# Patient Record
Sex: Female | Born: 1965 | Race: White | Hispanic: No | Marital: Married | Smoking: Never smoker
Health system: Southern US, Community
[De-identification: ages and names within clinical notes are randomized; demographics above are authoritative.]

---

## 1997-12-04 ENCOUNTER — Other Ambulatory Visit: Admission: RE | Admit: 1997-12-04 | Discharge: 1997-12-04 | Payer: Self-pay | Admitting: Obstetrics and Gynecology

## 1999-01-26 ENCOUNTER — Other Ambulatory Visit: Admission: RE | Admit: 1999-01-26 | Discharge: 1999-01-26 | Payer: Self-pay | Admitting: Obstetrics and Gynecology

## 1999-10-27 ENCOUNTER — Other Ambulatory Visit: Admission: RE | Admit: 1999-10-27 | Discharge: 1999-10-27 | Payer: Self-pay | Admitting: Obstetrics and Gynecology

## 2000-02-25 ENCOUNTER — Encounter: Admission: RE | Admit: 2000-02-25 | Discharge: 2000-05-25 | Payer: Self-pay | Admitting: Obstetrics & Gynecology

## 2000-05-30 ENCOUNTER — Inpatient Hospital Stay (HOSPITAL_COMMUNITY): Admission: AD | Admit: 2000-05-30 | Discharge: 2000-05-30 | Payer: Self-pay | Admitting: Obstetrics and Gynecology

## 2000-06-04 ENCOUNTER — Inpatient Hospital Stay (HOSPITAL_COMMUNITY): Admission: AD | Admit: 2000-06-04 | Discharge: 2000-06-04 | Payer: Self-pay | Admitting: Obstetrics and Gynecology

## 2000-06-04 ENCOUNTER — Inpatient Hospital Stay (HOSPITAL_COMMUNITY): Admission: AD | Admit: 2000-06-04 | Discharge: 2000-06-08 | Payer: Self-pay | Admitting: Obstetrics and Gynecology

## 2000-06-09 ENCOUNTER — Encounter: Admission: RE | Admit: 2000-06-09 | Discharge: 2000-09-07 | Payer: Self-pay | Admitting: Obstetrics and Gynecology

## 2000-07-18 ENCOUNTER — Other Ambulatory Visit: Admission: RE | Admit: 2000-07-18 | Discharge: 2000-07-18 | Payer: Self-pay | Admitting: Obstetrics and Gynecology

## 2000-09-09 ENCOUNTER — Encounter: Admission: RE | Admit: 2000-09-09 | Discharge: 2000-09-20 | Payer: Self-pay | Admitting: Obstetrics and Gynecology

## 2002-03-11 ENCOUNTER — Other Ambulatory Visit: Admission: RE | Admit: 2002-03-11 | Discharge: 2002-03-11 | Payer: Self-pay | Admitting: Obstetrics & Gynecology

## 2002-03-11 ENCOUNTER — Other Ambulatory Visit: Admission: RE | Admit: 2002-03-11 | Discharge: 2002-03-11 | Payer: Self-pay | Admitting: Obstetrics and Gynecology

## 2002-10-03 ENCOUNTER — Inpatient Hospital Stay (HOSPITAL_COMMUNITY): Admission: AD | Admit: 2002-10-03 | Discharge: 2002-10-07 | Payer: Self-pay | Admitting: *Deleted

## 2002-10-04 ENCOUNTER — Encounter (INDEPENDENT_AMBULATORY_CARE_PROVIDER_SITE_OTHER): Payer: Self-pay

## 2002-10-08 ENCOUNTER — Encounter: Admission: RE | Admit: 2002-10-08 | Discharge: 2002-11-07 | Payer: Self-pay | Admitting: Obstetrics & Gynecology

## 2002-10-10 ENCOUNTER — Encounter: Payer: Self-pay | Admitting: Obstetrics & Gynecology

## 2002-10-10 ENCOUNTER — Encounter: Admission: RE | Admit: 2002-10-10 | Discharge: 2002-10-10 | Payer: Self-pay | Admitting: Obstetrics & Gynecology

## 2002-10-31 ENCOUNTER — Other Ambulatory Visit: Admission: RE | Admit: 2002-10-31 | Discharge: 2002-10-31 | Payer: Self-pay | Admitting: Obstetrics & Gynecology

## 2002-11-08 ENCOUNTER — Encounter: Admission: RE | Admit: 2002-11-08 | Discharge: 2002-12-08 | Payer: Self-pay | Admitting: Obstetrics & Gynecology

## 2003-11-12 ENCOUNTER — Other Ambulatory Visit: Admission: RE | Admit: 2003-11-12 | Discharge: 2003-11-12 | Payer: Self-pay | Admitting: Obstetrics & Gynecology

## 2004-05-18 ENCOUNTER — Emergency Department (HOSPITAL_COMMUNITY): Admission: EM | Admit: 2004-05-18 | Discharge: 2004-05-18 | Payer: Self-pay | Admitting: Family Medicine

## 2004-11-29 ENCOUNTER — Ambulatory Visit: Payer: Self-pay | Admitting: Internal Medicine

## 2005-09-15 ENCOUNTER — Encounter: Admission: RE | Admit: 2005-09-15 | Discharge: 2005-09-15 | Payer: Self-pay | Admitting: Obstetrics & Gynecology

## 2005-09-27 ENCOUNTER — Encounter: Admission: RE | Admit: 2005-09-27 | Discharge: 2005-09-27 | Payer: Self-pay | Admitting: *Deleted

## 2006-10-06 ENCOUNTER — Encounter: Admission: RE | Admit: 2006-10-06 | Discharge: 2006-10-06 | Payer: Self-pay | Admitting: Obstetrics & Gynecology

## 2006-10-17 ENCOUNTER — Observation Stay (HOSPITAL_COMMUNITY): Admission: AD | Admit: 2006-10-17 | Discharge: 2006-10-17 | Payer: Self-pay | Admitting: General Surgery

## 2007-10-17 ENCOUNTER — Ambulatory Visit: Payer: Self-pay | Admitting: Internal Medicine

## 2007-10-17 DIAGNOSIS — J309 Allergic rhinitis, unspecified: Secondary | ICD-10-CM | POA: Insufficient documentation

## 2007-10-23 ENCOUNTER — Encounter: Admission: RE | Admit: 2007-10-23 | Discharge: 2007-10-23 | Payer: Self-pay | Admitting: Obstetrics & Gynecology

## 2007-10-29 ENCOUNTER — Encounter: Payer: Self-pay | Admitting: Internal Medicine

## 2007-10-29 LAB — CONVERTED CEMR LAB
Basophils Absolute: 0.1 K/uL
Basophils Relative: 0.9 %
Eosinophils Absolute: 0.1 K/uL
Eosinophils Relative: 0.7 %
HCT: 40.5 %
Hemoglobin: 13.7 g/dL
IgE (Immunoglobulin E), Serum: 88.1 [IU]/mL
Lymphocytes Relative: 19.4 %
MCHC: 33.7 g/dL
MCV: 87.7 fL
Monocytes Absolute: 0.4 K/uL
Monocytes Relative: 5.1 %
Neutro Abs: 6.4 K/uL
Neutrophils Relative %: 73.9 %
Platelets: 308 K/uL
RBC: 4.62 M/uL
RDW: 11.8 %
WBC: 8.7 10*3/microliter

## 2007-11-06 ENCOUNTER — Encounter: Payer: Self-pay | Admitting: Internal Medicine

## 2007-11-06 ENCOUNTER — Telehealth: Payer: Self-pay | Admitting: Internal Medicine

## 2007-11-08 ENCOUNTER — Ambulatory Visit: Payer: Self-pay | Admitting: Internal Medicine

## 2007-12-18 ENCOUNTER — Encounter: Payer: Self-pay | Admitting: Internal Medicine

## 2008-11-25 IMAGING — MG MM SCREEN MAMMOGRAM BILATERAL
4 series · 4 of 4 positions shown · non-contrast
Comparison: Prior studies.

DG SCREEN MAMMOGRAM BILATERAL
Bilateral CC and MLO view(s) were taken.
Technologist: Birmata Kobene

DIGITAL SCREENING MAMMOGRAM WITH CAD:

[R CC]
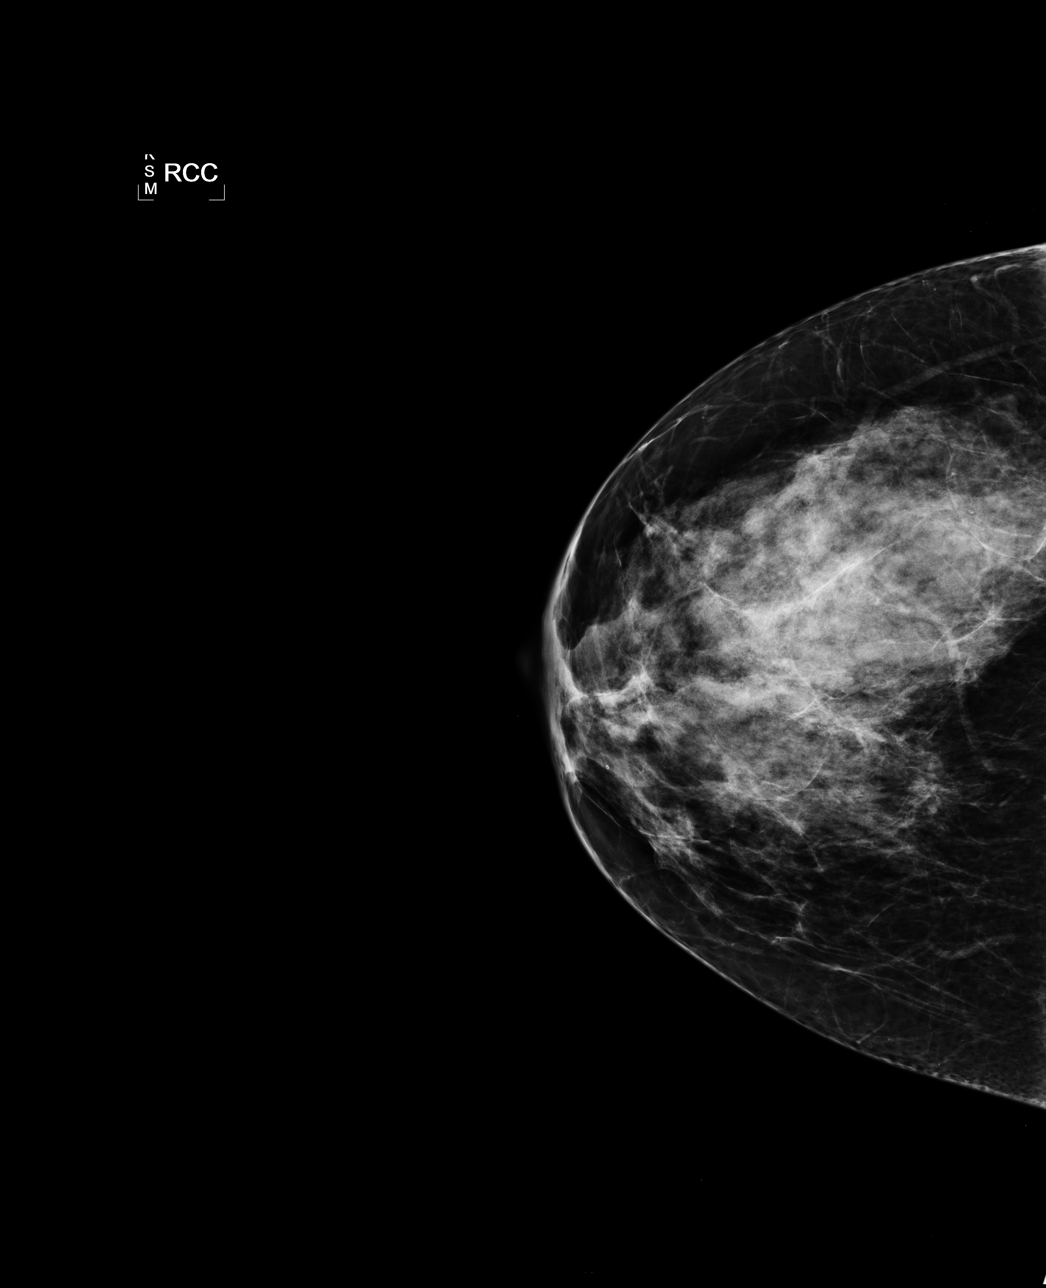

[L CC]
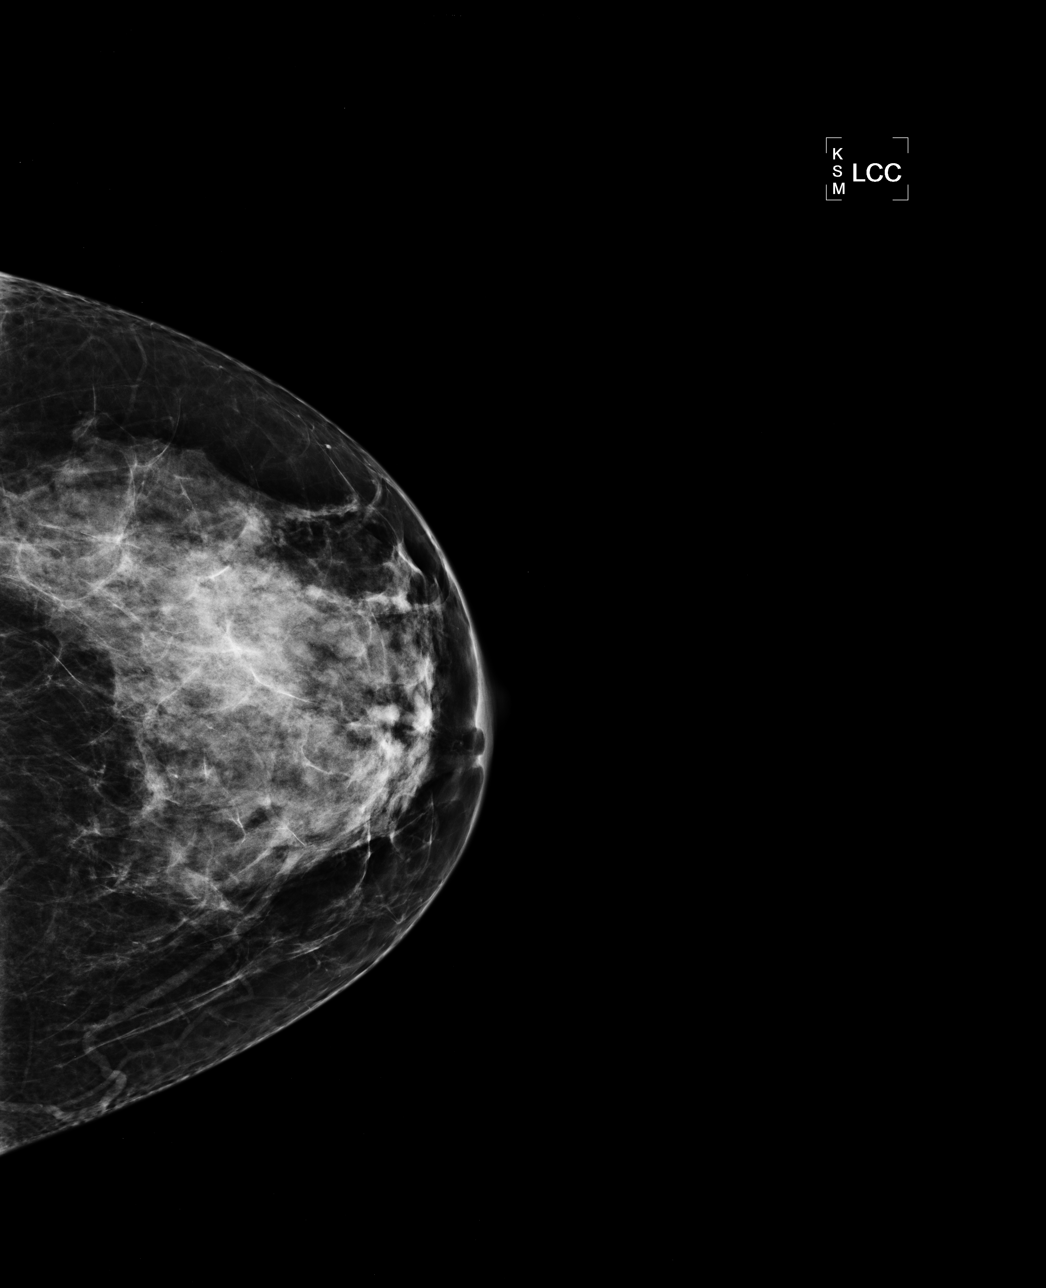

[L MLO]
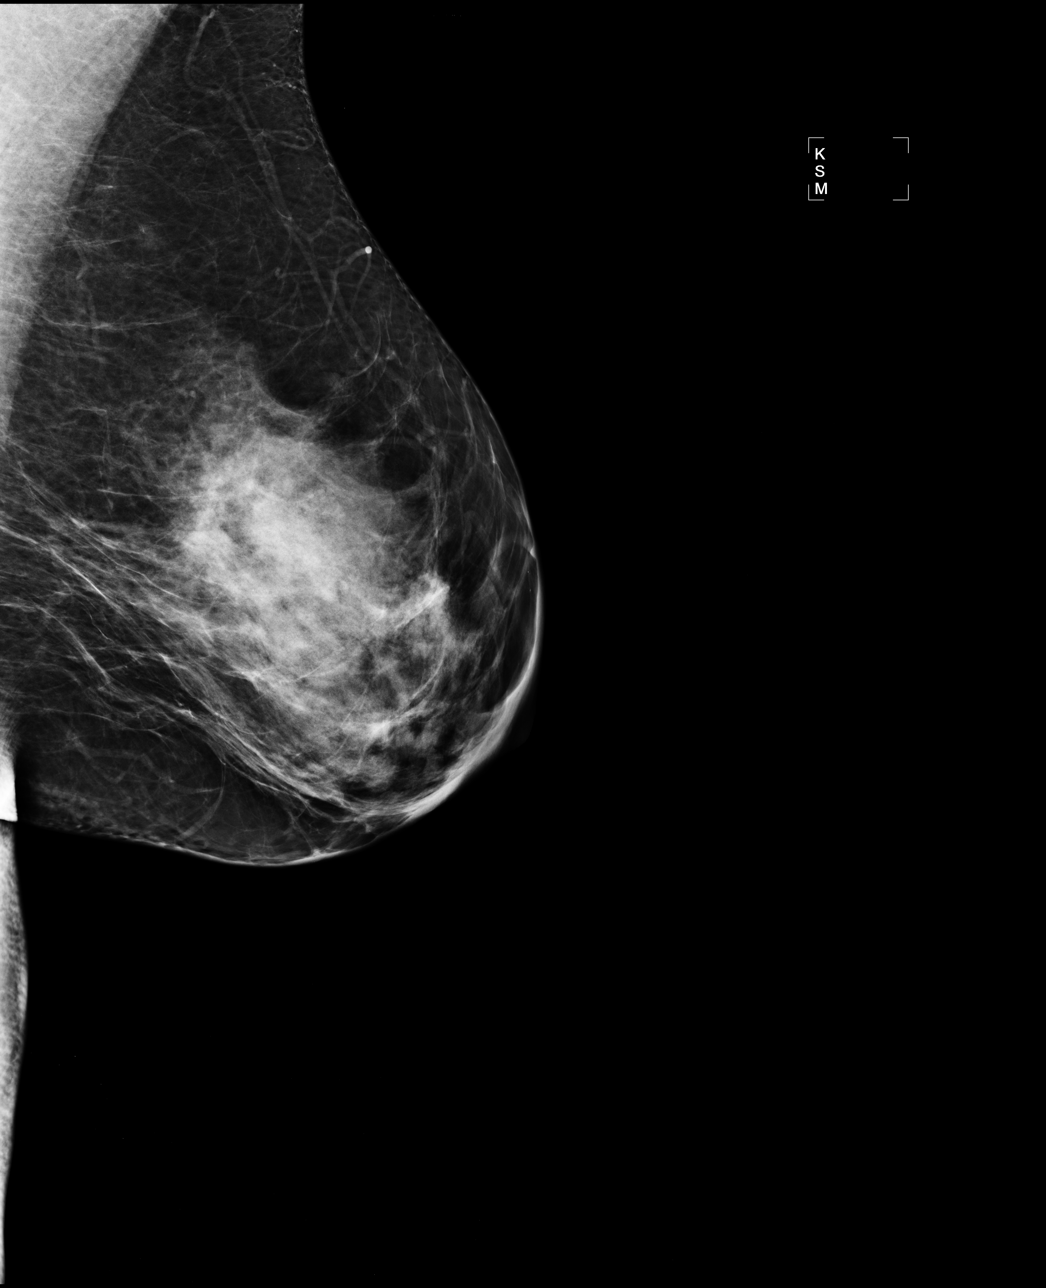

[R MLO]
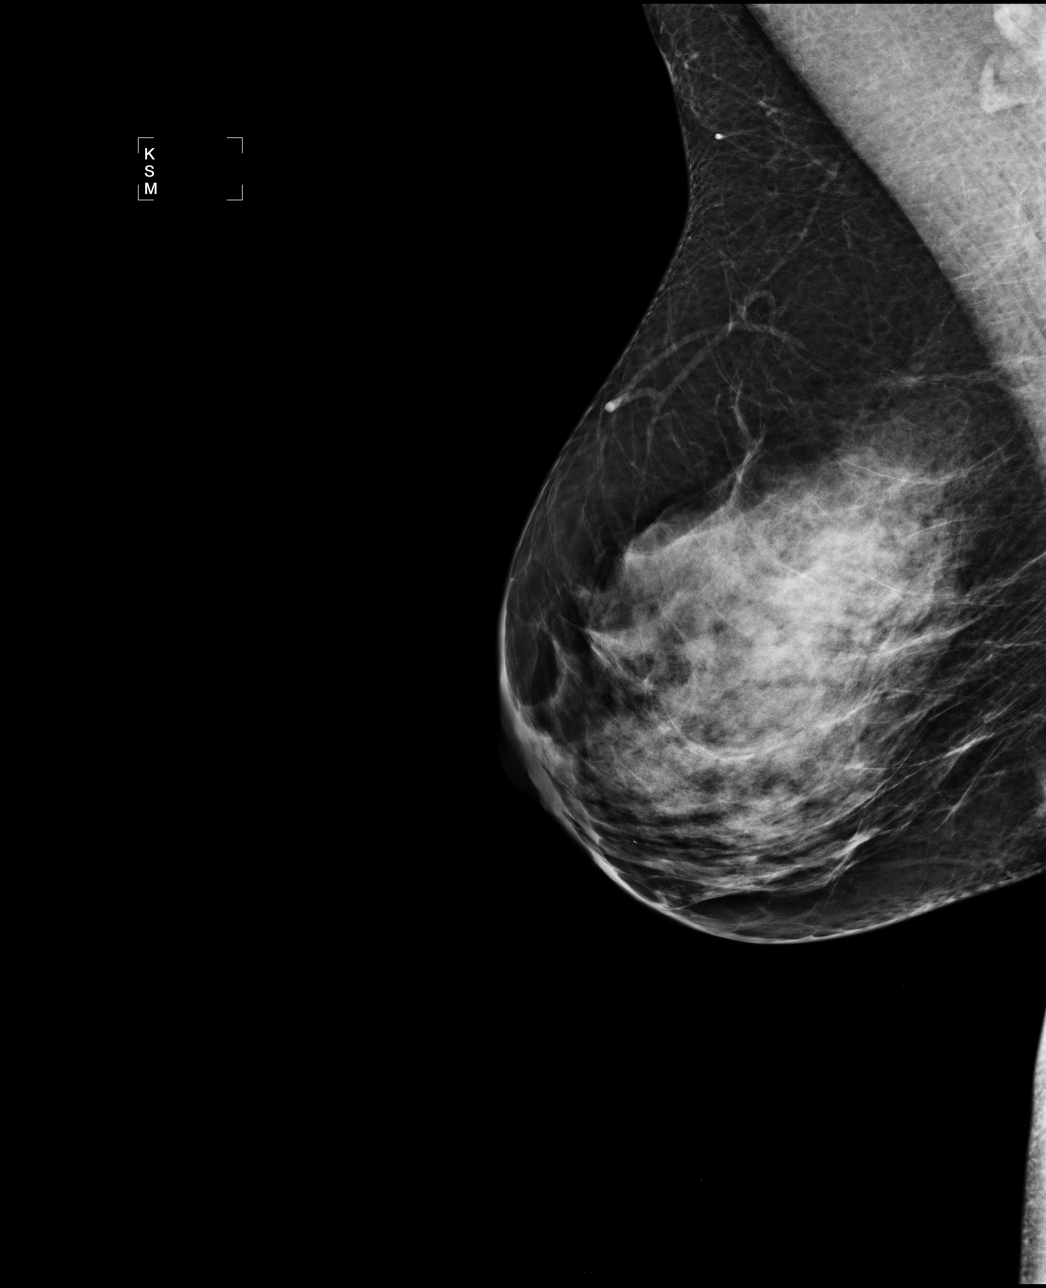

[4 of 4 positions shown; findings below may reference images not displayed]

The breast tissue is heterogeneously dense.  There is no dominant mass, architectural distortion or
calcification to suggest malignancy.
IMPRESSION: No mammographic evidence of malignancy.  Suggest yearly screening mammography.

ASSESSMENT: Negative - BI-RADS 1

Screening mammogram in 1 year.
ANALYZED BY COMPUTER AIDED DETECTION. , THIS PROCEDURE WAS A DIGITAL MAMMOGRAM.

## 2009-01-30 ENCOUNTER — Ambulatory Visit (HOSPITAL_COMMUNITY): Admission: RE | Admit: 2009-01-30 | Discharge: 2009-01-30 | Payer: Self-pay | Admitting: Family Medicine

## 2009-01-30 ENCOUNTER — Ambulatory Visit: Payer: Self-pay | Admitting: *Deleted

## 2009-02-27 ENCOUNTER — Other Ambulatory Visit: Admission: RE | Admit: 2009-02-27 | Discharge: 2009-02-27 | Payer: Self-pay | Admitting: Family Medicine

## 2010-07-19 ENCOUNTER — Encounter
Admission: RE | Admit: 2010-07-19 | Discharge: 2010-07-19 | Payer: Self-pay | Source: Home / Self Care | Attending: Obstetrics & Gynecology | Admitting: Obstetrics & Gynecology

## 2010-09-09 NOTE — Miscellaneous (Signed)
Summary: Injection Record/[facility]  Injection Record/[facility]   Imported By: Sherian Rein 12/29/2009 08:00:26  _____________________________________________________________________  External Attachment:    Type:   Image     Comment:   External Document

## 2010-12-24 NOTE — Op Note (Signed)
Christine Willis, Christine Willis             ACCOUNT NO.:  [id number]   MEDICAL RECORD NO.:  [id number]          PATIENT TYPE:  INP   LOCATION:  1321                         FACILITY:  [facility]   PHYSICIAN:  Ollen Gross. Vernell Morgans, M.D. DATE OF BIRTH:  March 15, 1966   DATE OF PROCEDURE:  10/17/2006  DATE OF DISCHARGE:  10/17/2006                               OPERATIVE REPORT   PREOPERATIVE DIAGNOSIS:  Perirectal abscess.   POSTOPERATIVE DIAGNOSES:  1. Perirectal abscess.  2. Anal fistula.   PROCEDURE:  I&D of perirectal abscess and placement of a seton.   SURGEON:  Dr. Carolynne Edouard   ANESTHESIA:  General endotracheal.   PROCEDURE:  After informed consent was obtained, the patient was brought  to the operating room and placed in a supine position on the operating  room table.  After adequate induction of general anesthesia, the patient  was placed in lithotomy position, and her perirectal area was prepped  with Betadine and draped in the usual sterile manner.  The abscess was  palpable in the left anterior perirectal region.  It could be palpated  easily both from within and outside the rectum.  A bullet retractor was  placed in the rectum and in the left anterior position.  A small opening  was identified.  A small silver probe was placed in this opening and  easily located the abscess.  The perirectal area was then infiltrated  with 0.25% Marcaine with epinephrine.  A small radial incision was made  on the skin overlying the abscess.  This incision was carried down  through the skin and into the subcutaneous tissue sharply with the  electrocautery.  A hemostat was used to pop into the perirectal abscess  cavity and allowed to drain.  It was also draining readily from within  the fistulous opening inside the rectum.  A silver probe was then placed  from inside the rectum into the abscess cavity and out through the skin.  A blue vesi-loop was then anchored to the end of the silver probe and  brought  through the fistulous opening.  The seton was placed under  tension and then cinched snugly around the bridge of tissue with a 3-0  silk tie.  Hemostasis was achieved using the Bovie electrocautery.  The  wound externally was then packed with a 4x4 gauze, and then sterile  dressings were applied.  The patient tolerated the procedure well.  At  the end of the case, all needle, sponge, and instrument counts were  correct.  The patient was then awakened and taken to recovery in stable  condition.      Ollen Gross. Vernell Morgans, M.D.  Electronically Signed     PST/MEDQ  D:  10/17/2006  T:  10/19/2006  Job:  [id number]

## 2010-12-24 NOTE — H&P (Signed)
NAME:  Christine Willis, Christine Willis                       ACCOUNT NO.:  [id number]   MEDICAL RECORD NO.:  [id number]                   PATIENT TYPE:  INP   LOCATION:  9170                                 FACILITY:  [facility]   PHYSICIAN:  Genia Del, M.D.             DATE OF BIRTH:  Apr 19, 1966   DATE OF ADMISSION:  10/03/2002  DATE OF DISCHARGE:                                HISTORY & PHYSICAL   HISTORY AND PHYSICAL:  The patient is a 45 year old G2, P1 at 40 weeks and 1  day gestation.  Expected date of delivery October 02, 2002 by ultrasound.   REASON FOR ADMISSION:  Induction post dates.   HISTORY OF PRESENT ILLNESS:  Fetal movements positive.  Irregular uterine  contractions.  No vaginal bleeding.  No fluid leak.  No PIH symptoms.   MEDICATIONS:  Prenate vitamins.   ALLERGIES:  Sensitivity:  NITROUS OXIDE and MORPHINE.   SOCIAL HISTORY:  Married.  Nonsmoker.   OBSTETRICS:  October 2001 40 weeks low transverse cesarean section for  failure to progress.  Baby was occiput posterior.  Girl 8 pounds 2 ounces.  The patient had postpartum depression.   GYNECOLOGY:  No STD.   PAST MEDICAL HISTORY:  1. IBS.  2. Migraines.   PAST SURGICAL HISTORY:  1. Liposuction.  2. Lasik eye surgery.  3. Rhinoplasty.   HISTORY OF PRESENT PREGNANCY:  First trimester was normal.  The patient had  a hemoglobin at 12.7, platelets 290,000.  Blood type O-.  Rh antibody  negative.  Toxo negative.  RPR nonreactive.  HBSAG negative.  HIV  nonreactive.  Rubella titer is immune.  Gonorrhea and Chlamydia negative.  Pap smear within normal limits.  In the second trimester after informed  consent the patient declined amniocentesis for advanced maternal age at 45  years old.  She had a triple test that was within normal limits at 17 weeks.  Ultrasound review of anatomy at 22+ weeks was within normal limits.  Expected date of delivery was changed because of discordance for October 02, 2001.  In the third  trimester patient declined RhoGAM for Rh negative  because her husband is Rh negative as well.  One hour GTT was 114, within  normal limits.  Group B Strep was negative at 35 weeks.  Uterine height  responded well throughout pregnancy.  An ultrasound showed an appropriate  for gestational age baby with amniotic fluid index within normal limits,  cephalic presentation.  Vaginal examination on last visit was 2+, 60-70%,  vertex, -2.  Membranes intact.  Blood pressures remained normal throughout  pregnancy.   REVIEW OF SYSTEMS:  CONSTITUTIONAL:  Negative.  HEENT:  Negative.  CARDIOVASCULAR:  Negative.  RESPIRATORY:  Negative.  GASTROINTESTINAL:  Negative.  UROLOGIC:  Negative.  DERMATOLOGIC:  Negative.  ENDOCRINOLOGIC:  Negative.  NEUROLOGIC:  Negative.   PHYSICAL EXAMINATION:  GENERAL:  No apparent distress.  VITAL SIGNS:  Blood pressure  100/63, pulse 90, temperature 97.5, respiratory  rate 20.  LUNGS:  Clear.  CARDIAC:  Regular rhythm.  ABDOMEN:  Gravid.  Cephalic presentation.  PELVIC:  Vaginal examination 3 cm, 70%, vertex, -2, fixed.  EXTREMITIES:  Lower limbs normal.   LABORATORIES:  Monitoring:  Fetal heart rate baseline 140-145, good  accelerations, no decelerations, reactive NST.  No regular uterine  contractions.   IMPRESSION:  1. G2, P1 40 weeks and 1 day gestation by ultrasound.  2. History of previous low transverse cesarean section.  3. Vaginal birth after cesarean candidate.  4. Induction for post dates.  5. Fetal well-being reassuring.  6. Group B Strep negative.   PLAN:  Admit to labor and delivery.  Low dose Pitocin protocol.  Artificial  rupture of membranes.  Monitoring.  Close follow-up.  Risks of uterine  rupture or dehiscence discussed with patient.  Evaluated at about 1%.  The  patient understands risks and procedures and agrees with management.                                               Genia Del, M.D.    ML/MEDQ  D:  10/03/2002  T:   10/03/2002  Job:  [id number]

## 2010-12-24 NOTE — Op Note (Signed)
[facility] [place]  Patient:    Christine Willis, Christine Willis                    MRN: [id number] Adm. Date:  [id number] Attending:  Cordelia Pen Ii                           Operative Report  PREOPERATIVE DIAGNOSIS:       Pregnancy at term with failure to descend.  POSTOPERATIVE DIAGNOSIS:      Pregnancy at term with failure to descend.  PROCEDURE:                    Low transverse cesarean section.  SURGEON:                      Juluis Mire, M.D.  ANESTHESIA:                   Epidural.  ESTIMATED BLOOD LOSS:         800 cc.  PACKS AND DRAINS:             None.  INTRAOPERATIVE BLOOD PLACED:  None.  COMPLICATIONS:                None.  INDICATIONS:                  A 45 year old prima gravid married white female presented with spontaneous onset of labor Sunday evening.  Did require Pitocin augmentation.  Became completely dilated.  After three hours plus of pushing the fetal vertex remained arrested at 0 station.  It was noted to be occiput posterior with a military orientation to the vertex.  We were unable to flex or mainly rotate the vertex.  Due to lack of descent and this presentation, the decision was decided to proceed with primary cesarean section.  The risks were discussed including the risk of infection, the risk of hemorrhage that could necessitate transfusion with the risk of AIDS or hepatitis, the risk of injury to adjacent organs including bladder, bowel, or ureters that could require further exploratory surgery, the risk of deep venous thrombosis and pulmonary embolus.  PROCEDURE:                    Patient taken to the OR and placed in supine position with left lateral tilt.  After satisfactory level of epidural anesthesia was obtained the abdomen was prepped with Betadine and draped in a sterile field.  A low transverse skin incision was made with a knife and carried through subcutaneous tissue.  The anterior rectus fascia  entered sharply.  The incision of the fascia extended laterally.  The fascia taken off the muscle superiorly and inferiorly using both blunt and sharp dissection. Rectus muscles were separated in the midline.  The peritoneum was entered sharply.  The incision of the perineum extended both superiorly and inferiorly.  Low transverse bladder flap was developed.  A low transverse uterine incision was begun with the knife and extended laterally using ______ traction.  The infant presented in the vertex presentation and delivered with elevation of head and fundal pressure.  The infant was occiput posterior and somewhat asynclitic.  The infant was a viable female who weighed 8 pounds 2 ounces.  Apgars were 9/9.  Umbilical pH is pending.  The placenta was then delivered manually.  The uterus wiped free of remaining membranes  and placenta.  Uterus was then closed with running locking suture of 0 Chromic using a two layer closure technique.  Hemostasis was excellent.  Urine output was slightly blood tinged, but clearing in the tube.  ______ irrigated. Hemostasis was excellent.  Tubes and ovaries visualized, noted to be unremarkable.  The muscles reapproximated with a running suture of 3-0 Vicryl. Fascia closed with a running suture of PDS.  Skin was closed with staples and Steri-Strips.  Sponge, instrument, needle counts were correct by ______ nurse x 2.  Foley catheter was clearing at the time of closure.  Patient tolerated procedure well and was returned to recovery room in good condition. DD:  06/05/00 TD:  06/05/00 Job: [id number]

## 2010-12-24 NOTE — Op Note (Signed)
NAME:  Christine Willis, Christine Willis                       ACCOUNT NO.:  [id number]   MEDICAL RECORD NO.:  [id number]                   PATIENT TYPE:  INP   LOCATION:  9103                                 FACILITY:  [facility]   PHYSICIAN:  Genia Del, M.D.             DATE OF BIRTH:  10/08/1965   DATE OF PROCEDURE:  10/04/2002  DATE OF DISCHARGE:                                 OPERATIVE REPORT   PREOPERATIVE DIAGNOSES:  1. A 40+ weeks gestation.  2. Unsuccessful V-back.  3. Failure to descend in second stage, associated with right posterior     occiput presentation.   POSTOPERATIVE DIAGNOSES:  1. A 40+ weeks gestation.  2. Unsuccessful V-back.  3. Failure to descend in second stage, associated with right posterior     occiput presentation.  4. Irregularly shaped uterus with no septum.  5. Uteroanterior abdominal wall adhesion.   SURGEON:  Genia Del, M.D.   ANESTHESIOLOGIST:  Estelle Grumbles. Fransisca Kaufmann, M.D.   INTERVENTION:  Repeat urgent low transverse cesarean section and lysis of  adhesions.   PROCEDURE:  Under epidural anesthesia, the patient is in 15-degree left  decubitus position.  She is prepped with Betadine on the abdomen, suprapubic  and vulvar areas.  The bladder catheter is already in place, and the patient  is draped as usual.  A Pfannenstiel incision was made with a scalpel at the  site of the previous scar.  We then opened the adipose tissue with the  electrocautery.  We opened the aponeurosis transversely with the Mayo  scissors, and freed the aponeurosis from the rectile muscles superiorly and  inferiorly.  The bladder is quite full; the bladder catheter is repositioned  -- which helps drain the bladder.  We then opened the peritoneum over the  lower uterine segment transversely with the neck scissors, and declined the  bladder downward.  The lower uterine segment is extremely thin and bulging,  but no rupture is present.  We then opened the lower uterine segment  transversely with the scalpel, and then extended on each side with the  scissors.  The amniotic fluid is clear.  The fetus is in cephalic  presentation, low in the pelvis (occiput posterior).  Birth of the baby boy  at 6:39 a.m.  The baby is suctioned; the cord is clamped and cut.  The  neonatal team takes care of the baby.  Apgars are 8 and 9.  The placenta is evacuated manually.  It is complete.  Revision of the uterus  is done.  The uterus is exteriorized for repair; it is well contracted with  Pitocin IV.  Ancef 1 g IV is given.  We note an irregularly shaped uterus.  The last horn is larger than the right horn, but no septation is present  inside the intrauterine cavity.  Both ovaries are normal in size and  appearance.  Both tubes are normal in size and appearance.  A  drawing of the  uterus is done in the written note in the chart.  We closed the hysterotomy  with a locked, running suture of 0 Vicryl, after repairing a left lower  uterine extension with the 0 Vicryl locked continuous fashion.  We then did  a second plane of embrocated  0 Vicryl on the hysterotomy.  An extra stitch of 0 Vicryl was then used on  the last aspect of the incision to complete hemostasis.  Hemostasis is  adequate.  We irrigated and suctioned the abdominopelvic cavity.  We removed  blood clots.  We then proceeded with lysis of adhesions at the level of the  anterior uterus.  A thick adhesion joins the uterus entirely to the anterior  abdominal wall.  This is lysed with electrocautery at coag and cutting mode.  Hemostasis is adequate at that level.  We then completed hemostasis at the  level of the bladder flap and at the level of the rectile muscles and  aponeurosis with the electrocautery.  We closed the aponeurosis with two  half-running sutures of 0 Vicryl.  We then completed hemostasis at the level  of the adipose tissue with the electrocautery.  Count of sponges and  instruments was complete x2.  We  closed the skin with staples, and a dry  dressing is applied.  Note that an infiltration of Marcaine 0.25% 10 cc was  done at the beginning of the intervention at the level of the subcutaneous  tissue of the incision.   ESTIMATED BLOOD LOSS:  800 cc.   DISPOSITION:  No complications occurred.  The patient was transferred to  recovery room in good status.   LABORATORY AND ACCESSORY DATA:  She is 0 negative, but her husband is 0  negative as well.  Rubella immune.                                               Genia Del, M.D.    ML/MEDQ  D:  10/04/2002  T:  10/04/2002  Job:  [id number]

## 2010-12-24 NOTE — Discharge Summary (Signed)
[facility]  Patient:    Christine Willis, Christine Willis                    MRN: [id number] Adm. Date:  [id number] Disc. Date: 06/08/00 Attending:  Cordelia Pen Ii Dictator:   Danie Chandler, R.N.                           Discharge Summary  ADMISSION DIAGNOSIS:          Intrauterine pregnancy at term with                               spontaneous onset of labor.  DISCHARGE DIAGNOSES:          1. Intrauterine pregnancy at term with                                  spontaneous onset of labor.                               2. Failure to descend.                               3. Anemia.  PROCEDURE:                    On June 05, 2000, primary low transverse cesarean section was performed.  REASON FOR ADMISSION:         The patient is a 44 year old married white female, gravida 1, para 0 who presented with spontaneous onset of labor.  The patient did require Pitocin augmentation. She became completely dilated and after 3+ hours of pushing, the fetal vertex remained arrested at 0 station. It was noted to be occiput posterior with a military orientation to the vertex.  They were unable to flex or rotate the vertex.  Due to lack of descent and the presentation, the decision was made to proceed with primary cesarean section.  HOSPITAL COURSE:              The patient was taken to the operating room and underwent the above named procedure without complications.  This was productive of a viable female infant with Apgars of 9 at one minute and 9 at five minutes.  Postoperatively, the patient did well.  On postoperative day #1, the patients hemoglobin was 7.8, hematocrit 22.3 and white blood cell count 16.7.  The patient was started on iron twice a day and had a repeat CBC ordered for the following day.  On postoperative day #2, the patient was tolerating a regular diet and had a good return of bowel function.  She was also ambulating well without difficulty  and had good pain control.  Her hemoglobin was stable at 7.2 this day and she was continued on iron twice a day.  The patient was discharged home on postoperative day #3 in stable condition.  CONDITION ON DISCHARGE:       Good.  DIET:                         Regular as tolerated.  ACTIVITY:  No heavy lifting, no driving, no vaginal entry.  FOLLOW-UP:                    She is to follow up in the office in one to two weeks for an incision check and she is to call for temperature greater than 100 degrees, persistent nausea or vomiting, heavy vaginal bleeding and/or redness or drainage from the incision site.  DISCHARGE MEDICATIONS:        1. Prenatal vitamins one p.o. q.d.                               2. Iron q.d.                               3. Tylox #30 one to two p.o. q.4h. p.r.n. pain. DD:  06/08/00 TD:  06/08/00 Job: [id number]

## 2010-12-24 NOTE — Discharge Summary (Signed)
NAMEHADJA, Christine Willis                       ACCOUNT NO.:  [id number]   MEDICAL RECORD NO.:  [id number]                   PATIENT TYPE:  INP   LOCATION:  9103                                 FACILITY:  [facility]   PHYSICIAN:  Genia Del, M.D.             DATE OF BIRTH:  14-Jul-1966   DATE OF ADMISSION:  10/03/2002  DATE OF DISCHARGE:  10/07/2002                                 DISCHARGE SUMMARY   ADMISSION DIAGNOSES:  1. 40 weeks and one day gestation.  2. History of previous cesarean section, for induction VBAC attempt.   DISCHARGE DIAGNOSES:  1. 40 weeks and one day gestation.  2. History of previous cesarean section, unsuccessful VBAC, repeat low     transverse cesarean section.  3. Birth of a baby boy on 10/04/02.   HOSPITAL COURSE:  [name] is a 45 year old G2, P1 at 40 weeks and one  day gestation who came for induction to attempt VBAC after one C-section.  Her previous C-section was done for failure to descend, it was a posterior  presentation at 40 weeks, the baby's weight was 8 pounds and 2 ounces.  The  pelvis was clinically adequate and the cervix was favorable at 3 cm, 70%,  vertex minus two.  The estimated fetal weight by ultrasound showed an  appropriate for gestational age baby.  The patient was thoroughly informed  of the risks associated with VBAC versus a repeat C-section and she strongly  opted for an attempt at [facility].  The induction was done with artificial  rupture of membranes and Pitocin.  The patient was covered with Penicillin G  for group B strep positive.  The patient progressed well in the latency  stage and the first phase of active labor.  She showed slow progression  after 7 cm and then was complete and ready to push around 1:20 a.m. on  10/04/02.  She pushed for approximately two hours and 45 minutes under  epidural and the baby presented from plus one station to plus three.  No  further descent occurred after that and the baby was again in  right  posterior occiput presentation.  Fetal heart rate was reassuring.  Because  of failure to descend the decision was taken to proceed with a repeat urgent  C-section.  A low transverse repeat urgent  C-section was done.  A baby boy was born at 6:39 a.m., Apgar's were 8 and 9.  The placenta was complete.  The escharotomy was cleared in two planes.  Ovaries were normal, tubes were normal.  The estimated blood loss was 800 cc  and no complication occurred.  The postoperative evolution was uneventful.  The patient had postoperative anemia with a hemoglobin of 9.9 and hematocrit  28.7.  She also complains of left lower leg numbness,  strength and  sensation were otherwise preserved.  Referral in neurology will be pursued  if the symptoms persist.   The  patient was prescribed Motrin p.r.n. as well as Tylox.  She will on an  iron supplement.  Postoperative advice was given.  The patient will follow  up at the [facility] office in four weeks.                                                Genia Del, M.D.    ML/MEDQ  D:  10/29/2002  T:  10/29/2002  Job:  [id number]

## 2013-09-06 ENCOUNTER — Other Ambulatory Visit: Payer: Self-pay | Admitting: Orthopedic Surgery

## 2013-09-06 DIAGNOSIS — M25521 Pain in right elbow: Secondary | ICD-10-CM

## 2013-09-27 ENCOUNTER — Encounter: Payer: Self-pay | Admitting: Obstetrics & Gynecology

## 2014-11-18 ENCOUNTER — Other Ambulatory Visit: Payer: Self-pay

## 2014-11-25 ENCOUNTER — Other Ambulatory Visit: Payer: Self-pay

## 2015-03-30 ENCOUNTER — Encounter: Payer: Self-pay | Admitting: Obstetrics and Gynecology

## 2015-06-15 ENCOUNTER — Encounter: Payer: Self-pay | Admitting: Internal Medicine

## 2015-07-17 ENCOUNTER — Encounter: Payer: Self-pay | Admitting: Internal Medicine

## 2015-11-10 ENCOUNTER — Other Ambulatory Visit (HOSPITAL_COMMUNITY)
Admission: RE | Admit: 2015-11-10 | Discharge: 2015-11-10 | Disposition: A | Discharge status: Home / Self Care | Payer: 59 | Source: Ambulatory Visit | Attending: Nurse Practitioner | Admitting: Nurse Practitioner

## 2015-11-10 ENCOUNTER — Other Ambulatory Visit: Payer: Self-pay | Admitting: Nurse Practitioner

## 2015-11-10 DIAGNOSIS — Z01419 Encounter for gynecological examination (general) (routine) without abnormal findings: Secondary | ICD-10-CM | POA: Diagnosis present

## 2015-11-10 DIAGNOSIS — Z1151 Encounter for screening for human papillomavirus (HPV): Secondary | ICD-10-CM | POA: Insufficient documentation

## 2015-11-16 LAB — CYTOLOGY - PAP

## 2016-08-15 DIAGNOSIS — Z79899 Other long term (current) drug therapy: Secondary | ICD-10-CM | POA: Diagnosis not present

## 2016-08-15 DIAGNOSIS — R5383 Other fatigue: Secondary | ICD-10-CM | POA: Diagnosis not present

## 2016-10-14 DIAGNOSIS — J3089 Other allergic rhinitis: Secondary | ICD-10-CM | POA: Diagnosis not present

## 2017-02-02 DIAGNOSIS — M7711 Lateral epicondylitis, right elbow: Secondary | ICD-10-CM | POA: Diagnosis not present

## 2017-02-02 DIAGNOSIS — M545 Low back pain: Secondary | ICD-10-CM | POA: Diagnosis not present

## 2017-03-10 DIAGNOSIS — H9202 Otalgia, left ear: Secondary | ICD-10-CM | POA: Diagnosis not present

## 2017-04-13 DIAGNOSIS — Z0000 Encounter for general adult medical examination without abnormal findings: Secondary | ICD-10-CM | POA: Diagnosis not present

## 2017-05-24 DIAGNOSIS — M47816 Spondylosis without myelopathy or radiculopathy, lumbar region: Secondary | ICD-10-CM | POA: Diagnosis not present

## 2017-05-24 DIAGNOSIS — M25521 Pain in right elbow: Secondary | ICD-10-CM | POA: Diagnosis not present

## 2017-05-24 DIAGNOSIS — M7711 Lateral epicondylitis, right elbow: Secondary | ICD-10-CM | POA: Diagnosis not present

## 2017-09-15 DIAGNOSIS — M7711 Lateral epicondylitis, right elbow: Secondary | ICD-10-CM | POA: Diagnosis not present

## 2017-09-15 DIAGNOSIS — S335XXA Sprain of ligaments of lumbar spine, initial encounter: Secondary | ICD-10-CM | POA: Diagnosis not present

## 2017-09-15 DIAGNOSIS — M5106 Intervertebral disc disorders with myelopathy, lumbar region: Secondary | ICD-10-CM | POA: Diagnosis not present

## 2017-09-20 DIAGNOSIS — M545 Low back pain: Secondary | ICD-10-CM | POA: Diagnosis not present

## 2017-10-18 DIAGNOSIS — M545 Low back pain: Secondary | ICD-10-CM | POA: Diagnosis not present

## 2017-10-24 DIAGNOSIS — M545 Low back pain: Secondary | ICD-10-CM | POA: Diagnosis not present

## 2017-10-24 DIAGNOSIS — M5106 Intervertebral disc disorders with myelopathy, lumbar region: Secondary | ICD-10-CM | POA: Diagnosis not present

## 2017-10-31 ENCOUNTER — Ambulatory Visit (INDEPENDENT_AMBULATORY_CARE_PROVIDER_SITE_OTHER): Payer: 59 | Admitting: Obstetrics & Gynecology

## 2017-10-31 ENCOUNTER — Encounter: Payer: Self-pay | Admitting: Obstetrics & Gynecology

## 2017-10-31 VITALS — BP 110/72 | Ht 65.0 in | Wt 171.0 lb

## 2017-10-31 DIAGNOSIS — F3341 Major depressive disorder, recurrent, in partial remission: Secondary | ICD-10-CM | POA: Diagnosis not present

## 2017-10-31 DIAGNOSIS — Z01419 Encounter for gynecological examination (general) (routine) without abnormal findings: Secondary | ICD-10-CM | POA: Diagnosis not present

## 2017-10-31 DIAGNOSIS — Z780 Asymptomatic menopausal state: Secondary | ICD-10-CM

## 2017-10-31 DIAGNOSIS — R87610 Atypical squamous cells of undetermined significance on cytologic smear of cervix (ASC-US): Secondary | ICD-10-CM | POA: Diagnosis not present

## 2017-10-31 MED ORDER — DULOXETINE HCL 60 MG PO CPEP: 60.0000 mg | ORAL_CAPSULE | Freq: Every day | ORAL | 5 refills | Status: DC

## 2017-10-31 NOTE — Progress Notes (Signed)
Christine Willis 1966/01/14 [id number]   History:    52 y.o. G2P2L2 Married  RP:  Established patient presenting for annual gyn exam   HPI: Menopause x about 8 months.  Mild vasomotor menopausal symptoms, hot flushes and night sweats, but improved on Phyto-Estrogens.  Declines HRT at this time.  No PMB.  No pelvic pain.  Using an over-the-counter lubricant, with that no pain with intercourse.  Normal vaginal secretions.  Urine and bowel movements normal.  Breasts normal.  Health labs with family physician.  Past medical history,surgical history, family history and social history were all reviewed and documented in the EPIC chart.  Gynecologic History No LMP recorded. (Menstrual status: Other). Contraception: post menopausal status Last Pap: 2017. Results were: normal per patient.  Will obtain med. records Last mammogram: 2018. Results were: normal per patient.  Will obtain med. records Bone Density: Never.  Will organize next year Colonoscopy normal at 52 yo, will repeat at [age].  Obstetric History OB History  Gravida Para Term Preterm AB Living  2 2       2   SAB TAB Ectopic Multiple Live Births               # Outcome Date GA Lbr Len/2nd Weight Sex Delivery Anes PTL Lv  2 Para           1 Para              ROS: A ROS was performed and pertinent positives and negatives are included in the history.  GENERAL: No fevers or chills. HEENT: No change in vision, no earache, sore throat or sinus congestion. NECK: No pain or stiffness. CARDIOVASCULAR: No chest pain or pressure. No palpitations. PULMONARY: No shortness of breath, cough or wheeze. GASTROINTESTINAL: No abdominal pain, nausea, vomiting or diarrhea, melena or bright red blood per rectum. GENITOURINARY: No urinary frequency, urgency, hesitancy or dysuria. MUSCULOSKELETAL: No joint or muscle pain, no back pain, no recent trauma. DERMATOLOGIC: No rash, no itching, no lesions. ENDOCRINE: No polyuria, polydipsia, no heat or cold  intolerance. No recent change in weight. HEMATOLOGICAL: No anemia or easy bruising or bleeding. NEUROLOGIC: No headache, seizures, numbness, tingling or weakness. PSYCHIATRIC: No depression, no loss of interest in normal activity or change in sleep pattern.     Exam:   BP 110/72   Ht 5\' 5"  (1.651 m)   Wt 171 lb (77.6 kg)   BMI 28.46 kg/m   Body mass index is 28.46 kg/m.  General appearance : Well developed well nourished female. No acute distress HEENT: Eyes: no retinal hemorrhage or exudates,  Neck supple, trachea midline, no carotid bruits, no thyroidmegaly Lungs: Clear to auscultation, no rhonchi or wheezes, or rib retractions  Heart: Regular rate and rhythm, no murmurs or gallops Breast:Examined in sitting and supine position were symmetrical in appearance, no palpable masses or tenderness,  no skin retraction, no nipple inversion, no nipple discharge, no skin discoloration, no axillary or supraclavicular lymphadenopathy Abdomen: no palpable masses or tenderness, no rebound or guarding Extremities: no edema or skin discoloration or tenderness  Pelvic: Vulva: Normal             Vagina: No gross lesions or discharge  Cervix: No gross lesions or discharge.  Pap reflex done  Uterus  AV, normal size, shape and consistency, non-tender and mobile  Adnexa  Without masses or tenderness  Anus: Normal   Assessment/Plan:  52 y.o. female for annual exam   1. Encounter for routine  gynecological examination with Papanicolaou smear of cervix Normal gynecologic exam.  Pap reflex done.  Breast exam normal.  Will schedule screening mammogram now.  Health labs with family physician.  Will schedule screening colonoscopy again at 55. - Pap IG w/ reflex to HPV when ASC-U  2. Menopause present Well on no hormone replacement therapy, but taking an over-the-counter phyto-estrogen.  Using a lubricant for intercourse.  Patient will call back to organize a visit if needs hormone replacement therapy in  the future.  Vitamin D supplements recommended, calcium rich nutrition and regular weightbearing physical activity.  Will organize bone density here next year.  3. Recurrent major depressive disorder, in partial remission (HCC) Stable on Cymbalta without suicidal ideation.  Cymbalta represcribed the same.  Other orders - Multiple Vitamin (MULTIVITAMIN) tablet; Take 1 tablet by mouth daily. - vitamin B-12 (CYANOCOBALAMIN) 100 MCG tablet; Take 100 mcg by mouth daily. - Biotin 1 MG CAPS; Take by mouth. - OVER THE COUNTER MEDICATION; Happy hippy - DULoxetine (CYMBALTA) 60 MG capsule; Take 1 capsule (60 mg total) by mouth daily.  Genia Del MD, 3:32 PM 10/31/2017

## 2017-10-31 NOTE — Patient Instructions (Signed)
1. Encounter for routine gynecological examination with Papanicolaou smear of cervix Normal gynecologic exam.  Pap reflex done.  Breast exam normal.  Will schedule screening mammogram now.  Health labs with family physician.  Will schedule screening colonoscopy again at 55. - Pap IG w/ reflex to HPV when ASC-U  2. Menopause present Well on no hormone replacement therapy, but taking an over-the-counter phyto-estrogen.  Using a lubricant for intercourse.  Patient will call back to organize a visit if needs hormone replacement therapy in the future.  Vitamin D supplements recommended, calcium rich nutrition and regular weightbearing physical activity.  Will organize bone density here next year.  3. Recurrent major depressive disorder, in partial remission ([place]) Stable on Cymbalta without suicidal ideation.  Cymbalta represcribed the same.  Other orders - Multiple Vitamin (MULTIVITAMIN) tablet; Take 1 tablet by mouth daily. - vitamin B-12 (CYANOCOBALAMIN) 100 MCG tablet; Take 100 mcg by mouth daily. - Biotin 1 MG CAPS; Take by mouth. - OVER THE COUNTER MEDICATION; Happy hippy - DULoxetine (CYMBALTA) 60 MG capsule; Take 1 capsule (60 mg total) by mouth daily.  Christine Willis, it was a pleasure seeing you today!  I will inform you of your results as soon as they are available.   Health Maintenance for Postmenopausal Women Menopause is a normal process in which your reproductive ability comes to an end. This process happens gradually over a span of months to years, usually between the ages of 58 and 6. Menopause is complete when you have missed 12 consecutive menstrual periods. It is important to talk with your health care provider about some of the most common conditions that affect postmenopausal women, such as heart disease, cancer, and bone loss (osteoporosis). Adopting a healthy lifestyle and getting preventive care can help to promote your health and wellness. Those actions can also lower your chances  of developing some of these common conditions. What should I know about menopause? During menopause, you may experience a number of symptoms, such as:  Moderate-to-severe hot flashes.  Night sweats.  Decrease in sex drive.  Mood swings.  Headaches.  Tiredness.  Irritability.  Memory problems.  Insomnia.  Choosing to treat or not to treat menopausal changes is an individual decision that you make with your health care provider. What should I know about hormone replacement therapy and supplements? Hormone therapy products are effective for treating symptoms that are associated with menopause, such as hot flashes and night sweats. Hormone replacement carries certain risks, especially as you become older. If you are thinking about using estrogen or estrogen with progestin treatments, discuss the benefits and risks with your health care provider. What should I know about heart disease and stroke? Heart disease, heart attack, and stroke become more likely as you age. This may be due, in part, to the hormonal changes that your body experiences during menopause. These can affect how your body processes dietary fats, triglycerides, and cholesterol. Heart attack and stroke are both medical emergencies. There are many things that you can do to help prevent heart disease and stroke:  Have your blood pressure checked at least every 1-2 years. High blood pressure causes heart disease and increases the risk of stroke.  If you are 51-39 years old, ask your health care provider if you should take aspirin to prevent a heart attack or a stroke.  Do not use any tobacco products, including cigarettes, chewing tobacco, or electronic cigarettes. If you need help quitting, ask your health care provider.  It is important to eat  a healthy diet and maintain a healthy weight. ? Be sure to include plenty of vegetables, fruits, low-fat dairy products, and lean protein. ? Avoid eating foods that are high in  solid fats, added sugars, or salt (sodium).  Get regular exercise. This is one of the most important things that you can do for your health. ? Try to exercise for at least 150 minutes each week. The type of exercise that you do should increase your heart rate and make you sweat. This is known as moderate-intensity exercise. ? Try to do strengthening exercises at least twice each week. Do these in addition to the moderate-intensity exercise.  Know your numbers.Ask your health care provider to check your cholesterol and your blood glucose. Continue to have your blood tested as directed by your health care provider.  What should I know about cancer screening? There are several types of cancer. Take the following steps to reduce your risk and to catch any cancer development as early as possible. Breast Cancer  Practice breast self-awareness. ? This means understanding how your breasts normally appear and feel. ? It also means doing regular breast self-exams. Let your health care provider know about any changes, no matter how small.  If you are 12 or older, have a clinician do a breast exam (clinical breast exam or CBE) every year. Depending on your age, family history, and medical history, it may be recommended that you also have a yearly breast X-ray (mammogram).  If you have a family history of breast cancer, talk with your health care provider about genetic screening.  If you are at high risk for breast cancer, talk with your health care provider about having an MRI and a mammogram every year.  Breast cancer (BRCA) gene test is recommended for women who have family members with BRCA-related cancers. Results of the assessment will determine the need for genetic counseling and BRCA1 and for BRCA2 testing. BRCA-related cancers include these types: ? Breast. This occurs in males or females. ? Ovarian. ? Tubal. This may also be called fallopian tube cancer. ? Cancer of the abdominal or pelvic  lining (peritoneal cancer). ? Prostate. ? Pancreatic.  Cervical, Uterine, and Ovarian Cancer Your health care provider may recommend that you be screened regularly for cancer of the pelvic organs. These include your ovaries, uterus, and vagina. This screening involves a pelvic exam, which includes checking for microscopic changes to the surface of your cervix (Pap test).  For women ages 21-65, health care providers may recommend a pelvic exam and a Pap test every three years. For women ages 32-65, they may recommend the Pap test and pelvic exam, combined with testing for human papilloma virus (HPV), every five years. Some types of HPV increase your risk of cervical cancer. Testing for HPV may also be done on women of any age who have unclear Pap test results.  Other health care providers may not recommend any screening for nonpregnant women who are considered low risk for pelvic cancer and have no symptoms. Ask your health care provider if a screening pelvic exam is right for you.  If you have had past treatment for cervical cancer or a condition that could lead to cancer, you need Pap tests and screening for cancer for at least 20 years after your treatment. If Pap tests have been discontinued for you, your risk factors (such as having a new sexual partner) need to be reassessed to determine if you should start having screenings again. Some women have medical  problems that increase the chance of getting cervical cancer. In these cases, your health care provider may recommend that you have screening and Pap tests more often.  If you have a family history of uterine cancer or ovarian cancer, talk with your health care provider about genetic screening.  If you have vaginal bleeding after reaching menopause, tell your health care provider.  There are currently no reliable tests available to screen for ovarian cancer.  Lung Cancer Lung cancer screening is recommended for adults 92-69 years old who  are at high risk for lung cancer because of a history of smoking. A yearly low-dose CT scan of the lungs is recommended if you:  Currently smoke.  Have a history of at least 30 pack-years of smoking and you currently smoke or have quit within the past 15 years. A pack-year is smoking an average of one pack of cigarettes per day for one year.  Yearly screening should:  Continue until it has been 15 years since you quit.  Stop if you develop a health problem that would prevent you from having lung cancer treatment.  Colorectal Cancer  This type of cancer can be detected and can often be prevented.  Routine colorectal cancer screening usually begins at age 89 and continues through age 41.  If you have risk factors for colon cancer, your health care provider may recommend that you be screened at an earlier age.  If you have a family history of colorectal cancer, talk with your health care provider about genetic screening.  Your health care provider may also recommend using home test kits to check for hidden blood in your stool.  A small camera at the end of a tube can be used to examine your colon directly (sigmoidoscopy or colonoscopy). This is done to check for the earliest forms of colorectal cancer.  Direct examination of the colon should be repeated every 5-10 years until age 4. However, if early forms of precancerous polyps or small growths are found or if you have a family history or genetic risk for colorectal cancer, you may need to be screened more often.  Skin Cancer  Check your skin from head to toe regularly.  Monitor any moles. Be sure to tell your health care provider: ? About any new moles or changes in moles, especially if there is a change in a mole's shape or color. ? If you have a mole that is larger than the size of a pencil eraser.  If any of your family members has a history of skin cancer, especially at a young age, talk with your health care provider about  genetic screening.  Always use sunscreen. Apply sunscreen liberally and repeatedly throughout the day.  Whenever you are outside, protect yourself by wearing long sleeves, pants, a wide-brimmed hat, and sunglasses.  What should I know about osteoporosis? Osteoporosis is a condition in which bone destruction happens more quickly than new bone creation. After menopause, you may be at an increased risk for osteoporosis. To help prevent osteoporosis or the bone fractures that can happen because of osteoporosis, the following is recommended:  If you are 29-52 years old, get at least 1,000 mg of calcium and at least 600 mg of vitamin D per day.  If you are older than age 52 but younger than age 87, get at least 1,200 mg of calcium and at least 600 mg of vitamin D per day.  If you are older than age 52, get at least 1,200 mg  of calcium and at least 800 mg of vitamin D per day.  Smoking and excessive alcohol intake increase the risk of osteoporosis. Eat foods that are rich in calcium and vitamin D, and do weight-bearing exercises several times each week as directed by your health care provider. What should I know about how menopause affects my mental health? Depression may occur at any age, but it is more common as you become older. Common symptoms of depression include:  Low or sad mood.  Changes in sleep patterns.  Changes in appetite or eating patterns.  Feeling an overall lack of motivation or enjoyment of activities that you previously enjoyed.  Frequent crying spells.  Talk with your health care provider if you think that you are experiencing depression. What should I know about immunizations? It is important that you get and maintain your immunizations. These include:  Tetanus, diphtheria, and pertussis (Tdap) booster vaccine.  Influenza every year before the flu season begins.  Pneumonia vaccine.  Shingles vaccine.  Your health care provider may also recommend other  immunizations. This information is not intended to replace advice given to you by your health care provider. Make sure you discuss any questions you have with your health care provider. Document Released: 09/16/2005 Document Revised: 02/12/2016 Document Reviewed: 04/28/2015 Elsevier Interactive Patient Education  2018 [employer].

## 2017-11-01 ENCOUNTER — Telehealth: Payer: Self-pay

## 2017-11-01 NOTE — Telephone Encounter (Signed)
Message left on phone number [phone] for a call back. We need to confirm contact phone numbers on the DPR for both Christine Willis and Christine Willis.

## 2017-11-01 NOTE — Telephone Encounter (Signed)
error 

## 2017-11-03 LAB — PAP IG W/ RFLX HPV ASCU

## 2017-11-03 LAB — HUMAN PAPILLOMAVIRUS, HIGH RISK: HPV DNA High Risk: NOT DETECTED

## 2017-11-08 ENCOUNTER — Telehealth: Payer: Self-pay

## 2017-11-08 NOTE — Telephone Encounter (Signed)
Agree 

## 2017-11-08 NOTE — Telephone Encounter (Signed)
Patient said when she was here last week for CE you were going to send her Singulair Rx in for one year. OK to send?      (Note To self:  Patient asked me to CALL IN her Singulair and Cymbalta Rx to [facility].  She wants it called in or written Rx so no chance she shows up for it and not there.Dedra Skeens/KA)

## 2017-11-09 ENCOUNTER — Other Ambulatory Visit: Payer: Self-pay

## 2017-11-09 MED ORDER — DULOXETINE HCL 60 MG PO CPEP: 60.0000 mg | ORAL_CAPSULE | Freq: Every day | ORAL | 1 refills | Status: DC

## 2017-11-09 MED ORDER — MONTELUKAST SODIUM 10 MG PO TABS: 10.0000 mg | ORAL_TABLET | Freq: Every day | ORAL | 3 refills | Status: AC

## 2017-11-09 NOTE — Telephone Encounter (Signed)
[facility] ph[facility] in and [facility] as [facility]. Patient informed.

## 2017-11-10 DIAGNOSIS — M545 Low back pain: Secondary | ICD-10-CM | POA: Diagnosis not present

## 2017-11-10 DIAGNOSIS — M47816 Spondylosis without myelopathy or radiculopathy, lumbar region: Secondary | ICD-10-CM | POA: Diagnosis not present

## 2017-11-17 DIAGNOSIS — M7711 Lateral epicondylitis, right elbow: Secondary | ICD-10-CM | POA: Diagnosis not present

## 2017-11-17 DIAGNOSIS — M25521 Pain in right elbow: Secondary | ICD-10-CM | POA: Diagnosis not present

## 2017-11-17 DIAGNOSIS — M7701 Medial epicondylitis, right elbow: Secondary | ICD-10-CM | POA: Diagnosis not present

## 2018-02-07 ENCOUNTER — Encounter: Payer: Self-pay | Admitting: Obstetrics & Gynecology

## 2018-02-07 ENCOUNTER — Ambulatory Visit (INDEPENDENT_AMBULATORY_CARE_PROVIDER_SITE_OTHER): Payer: 59 | Admitting: Obstetrics & Gynecology

## 2018-02-07 VITALS — BP 128/80

## 2018-02-07 DIAGNOSIS — N951 Menopausal and female climacteric states: Secondary | ICD-10-CM | POA: Diagnosis not present

## 2018-02-07 DIAGNOSIS — R87610 Atypical squamous cells of undetermined significance on cytologic smear of cervix (ASC-US): Secondary | ICD-10-CM

## 2018-02-07 DIAGNOSIS — N952 Postmenopausal atrophic vaginitis: Secondary | ICD-10-CM | POA: Diagnosis not present

## 2018-02-07 MED ORDER — PROGESTERONE MICRONIZED 100 MG PO CAPS: 100.0000 mg | ORAL_CAPSULE | Freq: Every day | ORAL | 4 refills | Status: DC

## 2018-02-07 MED ORDER — ESTRADIOL 0.1 MG/24HR TD PTTW: 1.0000 | MEDICATED_PATCH | TRANSDERMAL | 4 refills | Status: DC

## 2018-02-07 MED ORDER — ESTRADIOL 0.1 MG/GM VA CREA: 0.2500 | TOPICAL_CREAM | VAGINAL | 4 refills | Status: DC

## 2018-02-07 NOTE — Progress Notes (Addendum)
    Christine Willis 1965-08-23 [id number]        52 y.o.  G2P2L2  RP: Menopause syndrome/Discuss Pap test result  HPI: Severe hot flushes.  Dryness/pain with IC.  No CI to HRT.  No postmenopausal bleeding.  No pelvic pain.  Last Pap test October 31, 2017 showed ASCUS with negative high-risk HPV.  The transformation zone cells were present.  No history of abnormal Pap tests or cervical dysplasia.   OB History  Gravida Para Term Preterm AB Living  2 2       2   SAB TAB Ectopic Multiple Live Births               # Outcome Date GA Lbr Len/2nd Weight Sex Delivery Anes PTL Lv  2 Para           1 Para             Past medical history,surgical history, problem list, medications, allergies, family history and social history were all reviewed and documented in the EPIC chart.   Directed ROS with pertinent positives and negatives documented in the history of present illness/assessment and plan.  Exam:  Vitals:   02/07/18 1144  BP: 128/80   General appearance:  Normal  Gyn exam not needed today   Assessment/Plan:  52 y.o. G2P2   1. Menopause syndrome Severe vasomotor symptoms of menopause.  No contraindication to hormone replacement therapy.  Usage, risks and benefits of hormone replacement therapy thoroughly reviewed.  Increased risk of breast cancer after 10 years of use and mild increased risk of blood clot discussed.  Benefits of hormone replacement therapy to alleviate vasomotor symptoms and pain with intercourse, beneficial effect on bone mass preservation, cardiovascular benefits, decreased risk of colon cancer, skin and mood benefits reviewed.  Patch recommended for estradiol replacement therapy and Prometrium tablet at bedtime to protect the endometrial lining.  Estradiol patch 0.1 to change twice a week prescribed.  Progesterone 100 mg/caps. 1 capsule at bedtime also prescribed.  2. Post-menopausal atrophic vaginitis Decision to start on estradiol vaginal cream a quarter of an  applicator twice a week.  Usage reviewed and prescription sent to pharmacy.  3. ASCUS of cervix with negative high risk HPV Patient reassured given the negative high risk HPV and no previous history of abnormal Paps or cervical dysplasia.  Will repeat Pap test in March 2020.  Other orders - estradiol (VIVELLE-DOT) 0.1 MG/24HR patch; Place 1 patch (0.1 mg total) onto the skin 2 (two) times a week. - progesterone (PROMETRIUM) 100 MG capsule; Take 1 capsule (100 mg total) by mouth at bedtime. - estradiol (ESTRACE VAGINAL) 0.1 MG/GM vaginal cream; Place 0.25 Applicatorfuls vaginally 2 (two) times a week.  Counseling on above issues and coordination of care more than 50% for 40 minutes.  Christine DelMarie-Lyne Arlyn Bumpus MD, 12:00 PM 02/07/2018

## 2018-02-11 ENCOUNTER — Encounter: Payer: Self-pay | Admitting: Obstetrics & Gynecology

## 2018-02-11 NOTE — Patient Instructions (Signed)
1. Menopause syndrome Severe vasomotor symptoms of menopause.  No contraindication to hormone replacement therapy.  Usage, risks and benefits of hormone replacement therapy thoroughly reviewed.  Increased risk of breast cancer after 10 years of use and mild increased risk of blood clot discussed.  Benefits of hormone replacement therapy to alleviate vasomotor symptoms and pain with intercourse, beneficial effect on bone mass preservation, cardiovascular benefits, decreased risk of colon cancer, skin and mood benefits reviewed.  Patch recommended for estradiol replacement therapy and Prometrium tablet at bedtime to protect the endometrial lining.  Estradiol patch 0.1 to change twice a week prescribed.  Progesterone 100 mg/caps. 1 capsule at bedtime also prescribed.  2. Post-menopausal atrophic vaginitis Decision to start on estradiol vaginal cream a quarter of an applicator twice a week.  Usage reviewed and prescription sent to pharmacy.  3. ASCUS of cervix with negative high risk HPV Patient reassured given the negative high risk HPV and no previous history of abnormal Paps or cervical dysplasia.  Will repeat Pap test in March 2020.  Other orders - estradiol (VIVELLE-DOT) 0.1 MG/24HR patch; Place 1 patch (0.1 mg total) onto the skin 2 (two) times a week. - progesterone (PROMETRIUM) 100 MG capsule; Take 1 capsule (100 mg total) by mouth at bedtime. - estradiol (ESTRACE VAGINAL) 0.1 MG/GM vaginal cream; Place 0.25 Applicatorfuls vaginally 2 (two) times a week.  Christine Willis, it was a pleasure seeing you today!

## 2018-05-22 ENCOUNTER — Encounter: Payer: Self-pay | Admitting: Obstetrics & Gynecology

## 2018-05-22 ENCOUNTER — Telehealth: Payer: Self-pay

## 2018-05-22 ENCOUNTER — Ambulatory Visit (INDEPENDENT_AMBULATORY_CARE_PROVIDER_SITE_OTHER): Payer: 59 | Admitting: Obstetrics & Gynecology

## 2018-05-22 VITALS — BP 126/80

## 2018-05-22 DIAGNOSIS — R350 Frequency of micturition: Secondary | ICD-10-CM | POA: Diagnosis not present

## 2018-05-22 MED ORDER — FLUCONAZOLE 150 MG PO TABS: 150.0000 mg | ORAL_TABLET | Freq: Once | ORAL | 2 refills | Status: AC

## 2018-05-22 MED ORDER — CIPROFLOXACIN HCL 500 MG PO TABS: 500.0000 mg | ORAL_TABLET | Freq: Two times a day (BID) | ORAL | 0 refills | Status: AC

## 2018-05-22 NOTE — Progress Notes (Signed)
    Christine Willis September 30, 1965 [id number]        52 y.o.  G2P2L2  Married  RP: Urinary frequency x 2 days  HPI: Urinary frequency x 2 days, mild burning.  On Azo.  No pelvic pain.  No fever.  Normal vaginal secretions.  Menopause, well on HRT x 02/2018.  Estrogen cream vaginally x 3 weeks.  No PMB.   OB History  Gravida Para Term Preterm AB Living  2 2       2   SAB TAB Ectopic Multiple Live Births               # Outcome Date GA Lbr Len/2nd Weight Sex Delivery Anes PTL Lv  2 Para           1 Para             Past medical history,surgical history, problem list, medications, allergies, family history and social history were all reviewed and documented in the EPIC chart.   Directed ROS with pertinent positives and negatives documented in the history of present illness/assessment and plan.  Exam:  Vitals:   05/22/18 1615  BP: 126/80   General appearance:  Normal  CVAT negative bilaterally  Abdomen: Normal  Gynecologic exam: Deferred  U/A: Biochemistry removed due to Azo with color interference.  Microscopy: White blood cells 0-5, red blood cells negative, few bacteria.  Urine culture pending.   Assessment/Plan:  52 y.o. G2P2   1. Urinary frequency Probable acute cystitis.  Decision to treat with ciprofloxacin 500 mg per mouth twice a day for 7 days.  Pending urine culture.  Usage reviewed and prescription sent to pharmacy. - Urinalysis,Complete w/RFL Culture  Other orders - ciprofloxacin (CIPRO) 500 MG tablet; Take 1 tablet (500 mg total) by mouth 2 (two) times daily for 7 days. - fluconazole (DIFLUCAN) 150 MG tablet; Take 1 tablet (150 mg total) by mouth once for 1 dose.  Counseling on above issues and coordination of care more than 50% for 15 minutes.  Genia Del MD, 4:29 PM 05/22/2018

## 2018-05-22 NOTE — Patient Instructions (Signed)
1. Urinary frequency Probable acute cystitis.  Decision to treat with ciprofloxacin 500 mg per mouth twice a day for 7 days.  Pending urine culture.  Usage reviewed and prescription sent to pharmacy. - Urinalysis,Complete w/RFL Culture  Other orders - ciprofloxacin (CIPRO) 500 MG tablet; Take 1 tablet (500 mg total) by mouth 2 (two) times daily for 7 days. - fluconazole (DIFLUCAN) 150 MG tablet; Take 1 tablet (150 mg total) by mouth once for 1 dose.  Christine Willis, good seeing you today!  I will inform you of your urine culture results as soon as they are available.

## 2018-05-22 NOTE — Telephone Encounter (Signed)
Patient called in voice mail stating she had UTI and asked if Dr. Mackey Birchwood could send Rx.  I called her back and told her our protocol is office visit for u/a possible ur culture. Best to be sure what Dr. Mackey Birchwood is treating.  She agreed. [name] called her back and scheduled visit.

## 2018-06-15 DIAGNOSIS — R079 Chest pain, unspecified: Secondary | ICD-10-CM | POA: Diagnosis not present

## 2018-06-15 DIAGNOSIS — R5383 Other fatigue: Secondary | ICD-10-CM | POA: Diagnosis not present

## 2018-06-15 LAB — URINE CULTURE
MICRO NUMBER: 91243733
SPECIMEN QUALITY:: ADEQUATE

## 2018-06-15 LAB — URINALYSIS, COMPLETE W/RFL CULTURE
HYALINE CAST: NONE SEEN /LPF
RBC / HPF: NONE SEEN /HPF (ref 0–2)

## 2018-06-15 LAB — CULTURE INDICATED

## 2018-06-18 ENCOUNTER — Telehealth: Payer: Self-pay | Admitting: *Deleted

## 2018-06-18 NOTE — Telephone Encounter (Signed)
Left detailed message on cell. Asking her to call me if she wants sent to [facility].

## 2018-06-18 NOTE — Telephone Encounter (Signed)
We can switch her to Estradiol 1 mg/tab 1 tab PO daily #90, refill until next Annual/gyn visit.  If prefers to stay on the patch, she needs to give [place] more info.

## 2018-06-18 NOTE — Telephone Encounter (Signed)
Patient uses estradiol (vivelle dot patch) 0.1 mg twice weekly the adhesive with the [employer] manufacture is causing itching bumps on skin. She asked if another medication could be prescribed. I did explain to patient the office is not aware of the manufacture the pharmacy uses. Please advise

## 2018-10-02 ENCOUNTER — Other Ambulatory Visit: Payer: Self-pay

## 2018-10-02 NOTE — Telephone Encounter (Signed)
Annual is due at end of March.

## 2018-10-05 MED ORDER — DULOXETINE HCL 60 MG PO CPEP: 60.0000 mg | ORAL_CAPSULE | Freq: Every day | ORAL | 1 refills | Status: DC

## 2019-01-21 ENCOUNTER — Telehealth: Payer: Self-pay | Admitting: *Deleted

## 2019-01-21 MED ORDER — PROGESTERONE MICRONIZED 100 MG PO CAPS: 100.0000 mg | ORAL_CAPSULE | Freq: Every day | ORAL | 0 refills | Status: DC

## 2019-01-21 MED ORDER — ESTRADIOL 0.1 MG/24HR TD PTTW: 1.0000 | MEDICATED_PATCH | TRANSDERMAL | 0 refills | Status: DC

## 2019-01-21 NOTE — Telephone Encounter (Signed)
Patient called requesting 90 day supply of estradiol twice weekly 0.1 mg patch and Prometrium 100 mg capsule. Reports mother lives with her and due to covid-19 she is not able to come in now, will schedule annual visit in 3 months. I called and left message on voicemail I will send Rx however she does need to schedule annual.

## 2019-02-12 ENCOUNTER — Encounter: Payer: Self-pay | Admitting: Obstetrics & Gynecology

## 2019-03-04 NOTE — Telephone Encounter (Signed)
Dr. Dellis Filbert,  Christine Willis called stating she would like to try Divigel Estradiol Gel instead of estradiol patch.  Is this switch ok?  She will continue her Prometrium 100 mg.   Please advise.  Sharrie Rothman CMA

## 2019-03-05 MED ORDER — ESTRADIOL 0.25 MG/0.25GM TD GEL: 0.2500 mg | Freq: Every day | TRANSDERMAL | 5 refills | Status: DC

## 2019-03-05 MED ORDER — DULOXETINE HCL 60 MG PO CPEP: 60.0000 mg | ORAL_CAPSULE | Freq: Every day | ORAL | 1 refills | Status: DC

## 2019-03-05 NOTE — Telephone Encounter (Signed)
Rx sent and pt informed by email. Additional refills sent for Cymbalta 60mg  #90 1 RF and Prometrium 100 1 additional refill called in [place]

## 2019-03-05 NOTE — Addendum Note (Signed)
Addended by: Alen Blew on: 03/05/2019 02:19 PM   Modules accepted: Orders

## 2019-03-05 NOTE — Telephone Encounter (Signed)
Yes, agree with change.

## 2019-04-30 ENCOUNTER — Other Ambulatory Visit: Payer: Self-pay | Admitting: Obstetrics & Gynecology

## 2019-04-30 NOTE — Telephone Encounter (Signed)
Staff messaged Christine Willis and asked her to contact patient to schedule CE. Overdue since March.

## 2019-06-01 ENCOUNTER — Other Ambulatory Visit: Payer: Self-pay | Admitting: Obstetrics & Gynecology

## 2019-06-14 ENCOUNTER — Other Ambulatory Visit: Payer: Self-pay | Admitting: Obstetrics & Gynecology

## 2019-06-17 ENCOUNTER — Telehealth: Payer: Self-pay | Admitting: *Deleted

## 2019-06-17 NOTE — Telephone Encounter (Signed)
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  Per ML ok for refills 90 day with 1 refill.  Rx's to [place]

## 2019-12-20 ENCOUNTER — Other Ambulatory Visit: Payer: Self-pay

## 2019-12-23 ENCOUNTER — Ambulatory Visit (INDEPENDENT_AMBULATORY_CARE_PROVIDER_SITE_OTHER): Payer: 59 | Admitting: Obstetrics & Gynecology

## 2019-12-23 ENCOUNTER — Encounter: Payer: Self-pay | Admitting: Obstetrics & Gynecology

## 2019-12-23 ENCOUNTER — Other Ambulatory Visit: Payer: Self-pay

## 2019-12-23 VITALS — BP 124/78 | Ht 64.0 in | Wt 174.0 lb

## 2019-12-23 DIAGNOSIS — Z79890 Hormone replacement therapy (postmenopausal): Secondary | ICD-10-CM

## 2019-12-23 DIAGNOSIS — Z01419 Encounter for gynecological examination (general) (routine) without abnormal findings: Secondary | ICD-10-CM

## 2019-12-23 DIAGNOSIS — R102 Pelvic and perineal pain: Secondary | ICD-10-CM

## 2019-12-23 LAB — URINALYSIS, COMPLETE W/RFL CULTURE
Bacteria, UA: NONE SEEN /HPF
Bilirubin Urine: NEGATIVE
Glucose, UA: NEGATIVE
Hgb urine dipstick: NEGATIVE
Hyaline Cast: NONE SEEN /LPF
Ketones, ur: NEGATIVE
Leukocyte Esterase: NEGATIVE
Nitrites, Initial: NEGATIVE
Protein, ur: NEGATIVE
RBC / HPF: NONE SEEN /HPF (ref 0–2)
Specific Gravity, Urine: 1.025 (ref 1.001–1.03)
WBC, UA: NONE SEEN /HPF (ref 0–5)
pH: 5.5 (ref 5.0–8.0)

## 2019-12-23 LAB — NO CULTURE INDICATED

## 2019-12-23 MED ORDER — PROGESTERONE MICRONIZED 100 MG PO CAPS: 100.0000 mg | ORAL_CAPSULE | Freq: Every day | ORAL | 4 refills | Status: AC

## 2019-12-23 MED ORDER — ESTRADIOL 0.25 MG/0.25GM TD GEL: 0.2500 mg | Freq: Every day | TRANSDERMAL | 4 refills | Status: AC

## 2019-12-23 NOTE — Patient Instructions (Signed)
1. Well female exam with routine gynecological exam Normal gynecologic exam.  Pap reflex done.  Breast exam normal.  Screening mammogram July 2020 was benign.  Colonoscopy with Dr. Audley Hose.  Health labs with family physician.  Body mass index 29.87.  Recommend a slightly lower calorie/carb diet.  Patient is trying to move towards a more vegetarian nutrition.  Continue with fitness.  2. Postmenopausal hormone replacement therapy Well on Divigel and Prometrium daily.  No contraindication to continue on hormone replacement therapy.  Prescription sent to pharmacy.  3. Vaginal pain Has had referred pain to the vagina previously.  Seeing Dr. Audley Hose for evaluation later today.  Rule out acute cystitis.  Urine analysis negative. - Urinalysis,Complete w/RFL Culture  Other orders - NON FORMULARY; DIVIGEL AS DIRECTED - progesterone (PROMETRIUM) 100 MG capsule; Take 1 capsule (100 mg total) by mouth at bedtime. - Estradiol 0.25 MG/0.25GM GEL; Place 0.25 mg onto the skin daily. Apply to Rt or Lt thigh on alternating days.  Christine Willis, it was a pleasure seeing you today!  I will inform you of your results as soon as they are available.

## 2019-12-23 NOTE — Addendum Note (Signed)
Addended by: Tito Dine on: 12/23/2019 01:54 PM   Modules accepted: Orders

## 2019-12-23 NOTE — Progress Notes (Signed)
Christine Willis 08/27/1965 [id number]   History:    54 y.o. G2P2L2 Married  RP:  Established patient presenting for annual gyn exam   HPI:  Postmenopause x 2018.  Well on HRT with Divigel and Prometrium.  No PMB.  No pelvic pain.  Using an over-the-counter lubricant, with that no pain with intercourse.  Normal vaginal secretions.  Urine and bowel movements normal.  Breasts normal.  BMI 29.87.  Yoga and low aerobics.  Health labs with family physician.  Past medical history,surgical history, family history and social history were all reviewed and documented in the EPIC chart.  Gynecologic History Patient's last menstrual period was 12/22/2017.  Obstetric History OB History  Gravida Para Term Preterm AB Living  2 2       2   SAB TAB Ectopic Multiple Live Births               # Outcome Date GA Lbr Len/2nd Weight Sex Delivery Anes PTL Lv  2 Para           1 Para              ROS: A ROS was performed and pertinent positives and negatives are included in the history.  GENERAL: No fevers or chills. HEENT: No change in vision, no earache, sore throat or sinus congestion. NECK: No pain or stiffness. CARDIOVASCULAR: No chest pain or pressure. No palpitations. PULMONARY: No shortness of breath, cough or wheeze. GASTROINTESTINAL: No abdominal pain, nausea, vomiting or diarrhea, melena or bright red blood per rectum. GENITOURINARY: No urinary frequency, urgency, hesitancy or dysuria. MUSCULOSKELETAL: No joint or muscle pain, no back pain, no recent trauma. DERMATOLOGIC: No rash, no itching, no lesions. ENDOCRINE: No polyuria, polydipsia, no heat or cold intolerance. No recent change in weight. HEMATOLOGICAL: No anemia or easy bruising or bleeding. NEUROLOGIC: No headache, seizures, numbness, tingling or weakness. PSYCHIATRIC: No depression, no loss of interest in normal activity or change in sleep pattern.     Exam:   BP 124/78 (BP Location: Right Arm, Patient Position: Sitting, Cuff  Size: Normal)   Ht 5\' 4"  (1.626 m)   Wt 174 lb (78.9 kg)   LMP 12/22/2017   BMI 29.87 kg/m   Body mass index is 29.87 kg/m.  General appearance : Well developed well nourished female. No acute distress HEENT: Eyes: no retinal hemorrhage or exudates,  Neck supple, trachea midline, no carotid bruits, no thyroidmegaly Lungs: Clear to auscultation, no rhonchi or wheezes, or rib retractions  Heart: Regular rate and rhythm, no murmurs or gallops Breast:Examined in sitting and supine position were symmetrical in appearance, no palpable masses or tenderness,  no skin retraction, no nipple inversion, no nipple discharge, no skin discoloration, no axillary or supraclavicular lymphadenopathy Abdomen: no palpable masses or tenderness, no rebound or guarding Extremities: no edema or skin discoloration or tenderness  Pelvic: Vulva: Normal             Vagina: No gross lesions or discharge  Cervix: No gross lesions or discharge.  Pap reflex done.  Uterus  AV, normal size, shape and consistency, non-tender and mobile  Adnexa  Without masses or tenderness  Anus: Normal  U/A Negative   Assessment/Plan:  54 y.o. female for annual exam   1. Well female exam with routine gynecological exam Normal gynecologic exam.  Pap reflex done.  Breast exam normal.  Screening mammogram July 2020 was benign.  Colonoscopy with Dr. 57.  Health labs with family physician.  Body  mass index 29.87.  Recommend a slightly lower calorie/carb diet.  Patient is trying to move towards a more vegetarian nutrition.  Continue with fitness.  2. Postmenopausal hormone replacement therapy Well on Divigel and Prometrium daily.  No contraindication to continue on hormone replacement therapy.  Prescription sent to pharmacy.  3. Vaginal pain Has had referred pain to the vagina previously.  Seeing Dr. Almyra Free for evaluation later today.  Rule out acute cystitis.  Urine analysis negative. - Urinalysis,Complete w/RFL Culture  Other  orders - NON FORMULARY; DIVIGEL AS DIRECTED - progesterone (PROMETRIUM) 100 MG capsule; Take 1 capsule (100 mg total) by mouth at bedtime. - Estradiol 0.25 MG/0.25GM GEL; Place 0.25 mg onto the skin daily. Apply to Rt or Lt thigh on alternating days.  Princess Bruins MD, 12:21 PM 12/23/2019

## 2019-12-24 ENCOUNTER — Other Ambulatory Visit: Payer: Self-pay | Admitting: *Deleted

## 2019-12-24 MED ORDER — DULOXETINE HCL 60 MG PO CPEP: 60.0000 mg | ORAL_CAPSULE | Freq: Every day | ORAL | 3 refills | Status: AC

## 2019-12-24 NOTE — Telephone Encounter (Signed)
Requested refill on Cymbalta 60mg  #90 1 qd. Refills x 3. Called to .  Pt informed KW

## 2019-12-25 ENCOUNTER — Other Ambulatory Visit: Payer: Self-pay | Admitting: Obstetrics & Gynecology

## 2019-12-26 LAB — PAP IG W/ RFLX HPV ASCU

## 2019-12-26 LAB — HUMAN PAPILLOMAVIRUS, HIGH RISK: HPV DNA High Risk: NOT DETECTED

## 2020-01-02 ENCOUNTER — Other Ambulatory Visit: Payer: Self-pay

## 2020-01-02 ENCOUNTER — Ambulatory Visit (INDEPENDENT_AMBULATORY_CARE_PROVIDER_SITE_OTHER): Payer: 59 | Admitting: Obstetrics & Gynecology

## 2020-01-02 ENCOUNTER — Encounter: Payer: Self-pay | Admitting: Obstetrics & Gynecology

## 2020-01-02 VITALS — BP 130/90

## 2020-01-02 DIAGNOSIS — N898 Other specified noninflammatory disorders of vagina: Secondary | ICD-10-CM | POA: Diagnosis not present

## 2020-01-02 DIAGNOSIS — R87610 Atypical squamous cells of undetermined significance on cytologic smear of cervix (ASC-US): Secondary | ICD-10-CM | POA: Diagnosis not present

## 2020-01-02 DIAGNOSIS — N72 Inflammatory disease of cervix uteri: Secondary | ICD-10-CM | POA: Diagnosis not present

## 2020-01-02 LAB — WET PREP FOR TRICH, YEAST, CLUE

## 2020-01-02 NOTE — Progress Notes (Signed)
    Christine Willis 26-Jul-1966 [id number]        54 y.o.  G2P2L2 Married  RP: ASCUS x 2  HPI: ASCUS x 2.  HPV HR Negative both times.   OB History  Gravida Para Term Preterm AB Living  2 2       2   SAB TAB Ectopic Multiple Live Births               # Outcome Date GA Lbr Len/2nd Weight Sex Delivery Anes PTL Lv  2 Para           1 Para             Past medical history,surgical history, problem list, medications, allergies, family history and social history were all reviewed and documented in the EPIC chart.   Directed ROS with pertinent positives and negatives documented in the history of present illness/assessment and plan.  Exam:  Vitals:   01/02/20 1617  BP: 130/90   General appearance:  Normal  Colposcopy Procedure Note MARYBELLA ETHIER 01/02/2020  Indications:  ASCUS x 2/ HPV HR Negative  Procedure Details  The risks and benefits of the procedure and Verbal informed consent obtained.  Speculum placed in vagina and excellent visualization of cervix achieved, cervix swabbed x 3 with acetic acid solution.  Findings:  Cervix colposcopy: Physical Exam Genitourinary:       Vaginal colposcopy: Normal  Vulvar colposcopy: Normal  Perirectal colposcopy: Normal  The cervix was sprayed with Hurricane before performing the cervical biopsies.  Specimens: Cervical Bx at 12 O'Clock and 2 O'Clock.  Wet prep.  Complications:  None, hemostasis with Silver Nitrate . Plan:  Management per Cervical Bx results  Wet prep Negative   Assessment/Plan:  54 y.o. G2P2   1. Atypical squamous cells of undetermined significance (ASCUS) on Papanicolaou smear of cervix ASCUS last year and this year with high-risk HPV negative both times.  Counseling on abnormal Pap test done.  Colposcopy procedure reviewed.  Colposcopy findings discussed with patient.  Management per cervical biopsy results.  Postprocedure precautions reviewed. - Pathology Report ([facility])  2. Vaginal  discharge Wet prep negative.  Patient reassured. - WET PREP FOR TRICH, YEAST, CLUE  57 MD, 4:36 PM 01/02/2020

## 2020-01-03 ENCOUNTER — Encounter: Payer: Self-pay | Admitting: Obstetrics & Gynecology

## 2020-01-03 NOTE — Patient Instructions (Signed)
1. Atypical squamous cells of undetermined significance (ASCUS) on Papanicolaou smear of cervix ASCUS last year and this year with high-risk HPV negative both times.  Counseling on abnormal Pap test done.  Colposcopy procedure reviewed.  Colposcopy findings discussed with patient.  Management per cervical biopsy results.  Postprocedure precautions reviewed. - Pathology Report ([facility])  2. Vaginal discharge Wet prep negative.  Patient reassured. - WET PREP FOR TRICH, YEAST, CLUE  Christine Willis, it was a pleasure seeing you today!  I will inform you of your results as soon as they are available.

## 2020-01-08 LAB — PATHOLOGY REPORT

## 2020-01-08 LAB — TISSUE PATH REPORT

## 2020-11-17 ENCOUNTER — Other Ambulatory Visit: Payer: Self-pay | Admitting: *Deleted
